# Patient Record
Sex: Female | Born: 1950 | Race: White | Hispanic: No | Marital: Married | Smoking: Never smoker
Health system: Southern US, Community
[De-identification: ages and names within clinical notes are randomized; demographics above are authoritative.]

## PROBLEM LIST (undated history)

## (undated) DIAGNOSIS — I10 Essential (primary) hypertension: Secondary | ICD-10-CM

## (undated) DIAGNOSIS — I341 Nonrheumatic mitral (valve) prolapse: Secondary | ICD-10-CM

## (undated) DIAGNOSIS — M1990 Unspecified osteoarthritis, unspecified site: Secondary | ICD-10-CM

## (undated) HISTORY — DX: Essential (primary) hypertension: I10

## (undated) HISTORY — PX: FOOT SURGERY: SHX648

## (undated) HISTORY — PX: APPENDECTOMY: SHX54

## (undated) HISTORY — DX: Unspecified osteoarthritis, unspecified site: M19.90

## (undated) HISTORY — DX: Nonrheumatic mitral (valve) prolapse: I34.1

## (undated) HISTORY — PX: TONSILLECTOMY: SUR1361

---

## 1993-06-15 HISTORY — PX: ABDOMINAL HYSTERECTOMY: SUR658

## 2014-03-19 DIAGNOSIS — E7800 Pure hypercholesterolemia, unspecified: Secondary | ICD-10-CM | POA: Insufficient documentation

## 2015-06-16 HISTORY — PX: REPLACEMENT TOTAL KNEE: SUR1224

## 2021-07-09 DIAGNOSIS — L409 Psoriasis, unspecified: Secondary | ICD-10-CM | POA: Insufficient documentation

## 2021-09-11 NOTE — Progress Notes (Signed)
?Terrilee Files D.O. ?[facility] ?[address] [place] [address] ?Phone: [phone] ?Subjective:   ?I, Wilford Grist, am serving as a scribe for Dr. Antoine Primas. ? ?This visit occurred during the SARS-CoV-2 public health emergency.  Safety protocols were in place, including screening questions prior to the visit, additional usage of staff PPE, and extensive cleaning of exam room while observing appropriate contact time as indicated for disinfecting solutions.  ? ?I'm seeing this patient by the request  of:  Nadara Eaton, MD ? ?CC: left hip pain  ? ?ONG:[id number]  ?Molly Cabrera is a 71 y.o. female coming in with complaint of L hip bursitis for past 8 months. Saw ortho and was given exercises for GT which she did not do. Wanted injection but other provider wanted her to have MRI. Pain has subsided as she has joined exercise class. Feels 70% better. Pain worse with walking up hills or stairs.  ? ?R elbow pain that she has started to notice in exercise class pain with extension over olecranon with weighted exercises. Denies any numbness or tingling.  ? ? ? ?  ? ?No past medical history on file. ? ?Social History  ? ?Socioeconomic History  ? Marital status: Married  ?  Spouse name: Not on file  ? Number of children: Not on file  ? Years of education: Not on file  ? Highest education level: Not on file  ?Occupational History  ? Not on file  ?Tobacco Use  ? Smoking status: Not on file  ? Smokeless tobacco: Not on file  ?Substance and Sexual Activity  ? Alcohol use: Not on file  ? Drug use: Not on file  ? Sexual activity: Not on file  ?Other Topics Concern  ? Not on file  ?Social History Narrative  ? Not on file  ? ?Social Determinants of Health  ? ?Financial Resource Strain: Not on file  ?Food Insecurity: Not on file  ?Transportation Needs: Not on file  ?Physical Activity: Not on file  ?Stress: Not on file  ?Social Connections: Not on file  ? ?Not on File ?No family history on file. ? ? ?Current  Outpatient Medications (Cardiovascular):  ?  atorvastatin (LIPITOR) 10 MG tablet, Take by mouth daily. ?  losartan-hydrochlorothiazide (HYZAAR) 100-25 MG tablet, Take 1 tablet by mouth daily. ? ? ? ?Current Outpatient Medications (Hematological):  ?  vitamin B-12 (CYANOCOBALAMIN) 1000 MCG tablet, Take 1,000 mcg by mouth once a week. ? ?Current Outpatient Medications (Other):  ?  cholecalciferol (VITAMIN D3) 25 MCG (1000 UNIT) tablet, Take 2,000 mcg by mouth once a week. ?  famotidine (PEPCID) 20 MG tablet, Take 20 mg by mouth 2 (two) times daily. ?  magnesium oxide (MAG-OX) 400 MG tablet, Take 400 mg by mouth 2 (two) times daily. ? ? ?Reviewed prior external information including notes and imaging from  ?primary care provider ?As well as notes that were available from care everywhere and other healthcare systems. ? ?Past medical history, social, surgical and family history all reviewed in electronic medical record.  No pertanent information unless stated regarding to the chief complaint.  ? ?Review of Systems: ? No headache, visual changes, nausea, vomiting, diarrhea, constipation, dizziness, abdominal pain, skin rash, fevers, chills, night sweats, weight loss, swollen lymph nodes, body aches, joint swelling, chest pain, shortness of breath, mood changes. POSITIVE muscle aches ? ?Objective  ?Blood pressure 120/76, pulse 75, height 5' 3.5" (1.613 m), SpO2 95 %. ?  ?General: No apparent distress alert  and oriented x3 mood and affect normal, dressed appropriately.  ?HEENT: Pupils equal, extraocular movements intact  ?Respiratory: Patient's speak in full sentences and does not appear short of breath  ?Cardiovascular: No lower extremity edema, non tender, no erythema  ?Gait normal with good balance and coordination.  ?MSK: Left hip exam does have severe tenderness to palpation over the greater trochanteric area.  Negative straight leg test.  No pain in the back itself.  Patient has 4 out of 5 strength of the gluteal  tendon compared to the contralateral side. ?Right elbow exam shows tenderness to palpation over the lateral epicondylar region.  Worsening pain with resisted wrist extension. ? ? ?Procedure: Real-time Ultrasound Guided Injection of left  greater trochanteric bursitis secondary to patient's body habitus ?Device: GE Logiq Q7  ?Ultrasound guided injection is preferred based studies that show increased duration, increased effect, greater accuracy, decreased procedural pain, increased response rate, and decreased cost with ultrasound guided versus blind injection.  ?Verbal informed consent obtained.  ?Time-out conducted.  ?Noted no overlying erythema, induration, or other signs of local infection.  ?Skin prepped in a sterile fashion.  ?Local anesthesia: Topical Ethyl chloride.  ?With sterile technique and under real time ultrasound guidance:  Greater trochanteric area was visualized and patient's bursa was noted. A 22-gauge 3 inch needle was inserted and 4 cc of 0.5% Marcaine and 1 cc of Kenalog 40 mg/dL was injected. Pictures taken ?Completed without difficulty  ?Pain immediately resolved suggesting accurate placement of the medication.  ?Advised to call if fevers/chills, erythema, induration, drainage, or persistent bleeding.  ?Impression: Technically successful ultrasound guided injection. ? ?Limited muscular skeletal ultrasound was performed and interpreted by Antoine Primas, M  ?Limited ultrasound of patient's right elbow shows the patient does have some hypoechoic changes that can be consistent with potential interstitial tearing of the common extensor tendon.  Patient does not have any significant neovascularization in the area.  Some cortical irregularities noted. 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 ?  ?Impression and Recommendations:  ?  ? ?The above documentation has been reviewed and is accurate and complete Judi Saa, DO ? ? ? ?

## 2021-09-16 ENCOUNTER — Ambulatory Visit: Payer: Medicare HMO | Admitting: Family Medicine

## 2021-09-16 ENCOUNTER — Ambulatory Visit: Payer: Self-pay

## 2021-09-16 VITALS — BP 120/76 | HR 75 | Ht 63.5 in

## 2021-09-16 DIAGNOSIS — M25552 Pain in left hip: Secondary | ICD-10-CM | POA: Diagnosis not present

## 2021-09-16 DIAGNOSIS — K52839 Microscopic colitis, unspecified: Secondary | ICD-10-CM | POA: Insufficient documentation

## 2021-09-16 DIAGNOSIS — M7062 Trochanteric bursitis, left hip: Secondary | ICD-10-CM | POA: Diagnosis not present

## 2021-09-16 DIAGNOSIS — M7711 Lateral epicondylitis, right elbow: Secondary | ICD-10-CM

## 2021-09-16 DIAGNOSIS — M7710 Lateral epicondylitis, unspecified elbow: Secondary | ICD-10-CM | POA: Insufficient documentation

## 2021-09-16 DIAGNOSIS — E559 Vitamin D deficiency, unspecified: Secondary | ICD-10-CM | POA: Insufficient documentation

## 2021-09-16 DIAGNOSIS — E538 Deficiency of other specified B group vitamins: Secondary | ICD-10-CM | POA: Insufficient documentation

## 2021-09-16 NOTE — Assessment & Plan Note (Signed)
Elbow anatomy was reviewed, and tendinopathy was explained. ? ?Pt. given a home rehab program. Start with isometrics and ROM, then a series of concentric and eccentric exercises should be done starting with no weight, work up to 1 lb, hammer, etc. ? ?Use counterforce strap if working or using hands. ? ?Formal PT would be beneficial. ?Emphasized stretching an cross-friction massage ?Emphasized proper palms up lifting biomechanics to unload ECRB ?Patient will follow-up again in 6 to 8 weeks ?

## 2021-09-16 NOTE — Patient Instructions (Addendum)
Injected GT  ?Exercises ?Wear brace day and night for 2 weeks and then at night for 2 weeks ?See me again in 7-8 weeks ? ?

## 2021-09-16 NOTE — Assessment & Plan Note (Signed)
Patient given injection today and tolerated the procedure well, discussed icing regimen and home exercises.  Discussed with patient that there is a possibility for a gluteal tendon tear previously that likely is improving at the moment.  Discussed which activities to do which ones to avoid.  Increase activity slowly.  Discussed which activities to do which wants to avoid.  Follow-up again in 6 to 8 weeks.  Worsening pain will consider repeating x-rays and potential advanced imaging but hopefully will not be necessary. ?

## 2021-10-21 NOTE — Progress Notes (Signed)
?Molly Cabrera D.O. ?[facility] ?[place] ?Phone: [phone] ?Subjective:   ?I, Molly Cabrera, am serving as a Education administrator for Dr. Hulan Saas. ?This visit occurred during the SARS-CoV-2 public health emergency.  Safety protocols were in place, including screening questions prior to the visit, additional usage of staff PPE, and extensive cleaning of exam room while observing appropriate contact time as indicated for disinfecting solutions.  ? ?I'm seeing this patient by the request  of:  Javier Glazier, MD ? ?CC: Elbow and hip pain follow-up ? ?[id number]  ?09/16/2021 ?Elbow anatomy was reviewed, and tendinopathy was explained. ?  ?Pt. given a home rehab program. Start with isometrics and ROM, then a series of concentric and eccentric exercises should be done starting with no weight, work up to 1 lb, hammer, etc. ?  ?Use counterforce strap if working or using hands. ?  ?Formal PT would be beneficial. ?Emphasized stretching an cross-friction massage ?Emphasized proper palms up lifting biomechanics to unload ECRB ?Patient will follow-up again in 6 to 8 weeks ? ?Patient given injection today and tolerated the procedure well, discussed icing regimen and home exercises.  Discussed with patient that there is a possibility for a gluteal tendon tear previously that likely is improving at the moment.  Discussed which activities to do which ones to avoid.  Increase activity slowly.  Discussed which activities to do which wants to avoid.  Follow-up again in 6 to 8 weeks.  Worsening pain will consider repeating x-rays and potential advanced imaging but hopefully will not be necessary. ? ?Updated 10/22/2021 ?Molly Cabrera is a 71 y.o. female coming in with complaint of hip and wrist pain. Hip has been doing well. Elbow doing better after wearing brace. But brace caused pain in wrist. No pain in hip or elbow.  Patient states she did fall backwards and did hurt her back a little bit.   Still has some pain when she does certain range of motion of the back mostly just going from a seated to standing position.  Patient denies any radiation down the leg, any numbness or tingling. ? ? ? ?  ?Social History  ? ?Socioeconomic History  ? Marital status: Married  ?  Spouse name: Not on file  ? Number of children: Not on file  ? Years of education: Not on file  ? Highest education level: Not on file  ?Occupational History  ? Not on file  ?Tobacco Use  ? Smoking status: Not on file  ? Smokeless tobacco: Not on file  ?Substance and Sexual Activity  ? Alcohol use: Not on file  ? Drug use: Not on file  ? Sexual activity: Not on file  ?Other Topics Concern  ? Not on file  ?Social History Narrative  ? Not on file  ? ?Social Determinants of Health  ? ?Financial Resource Strain: Not on file  ?Food Insecurity: Not on file  ?Transportation Needs: Not on file  ?Physical Activity: Not on file  ?Stress: Not on file  ?Social Connections: Not on file  ? ?Not on File ?No family history on file. ? ? ?Current Outpatient Medications (Cardiovascular):  ?  atorvastatin (LIPITOR) 10 MG tablet, Take by mouth daily. ?  losartan-hydrochlorothiazide (HYZAAR) 100-25 MG tablet, Take 1 tablet by mouth daily. ? ? ? ?Current Outpatient Medications (Hematological):  ?  vitamin B-12 (CYANOCOBALAMIN) 1000 MCG tablet, Take 1,000 mcg by mouth once a week. ? ?Current Outpatient Medications (Other):  ?  cholecalciferol (VITAMIN D3)  25 MCG (1000 UNIT) tablet, Take 2,000 mcg by mouth once a week. ?  famotidine (PEPCID) 20 MG tablet, Take 20 mg by mouth 2 (two) times daily. ?  magnesium oxide (MAG-OX) 400 MG tablet, Take 400 mg by mouth 2 (two) times daily. ? ? ? ? ?Objective  ?Blood pressure 120/84, pulse 71, height 5\' 3"  (1.6 m), weight 148 lb (67.1 kg), SpO2 96 %. ?  ?General: No apparent distress alert and oriented x3 mood and affect normal, dressed appropriately.  ?HEENT: Pupils equal, extraocular movements intact  ?Respiratory: Patient's  speak in full sentences and does not appear short of breath  ?Cardiovascular: No lower extremity edema, non tender, no erythema  ?Gait normal with good balance and coordination.  ?MSK: Mild arthritic changes of multiple joints.  Patient's right wrist does have very mild atrophy on the dorsal aspect compared to the contralateral side.  Good grip strength are noted.  Nontender on exam.  Full strength noted.  Patient's elbow no significant tenderness noted.  Back exam very mild loss of lordosis.  Minimal tenderness to palpation but no midline tenderness.  Mild tightness with FABER test but otherwise unremarkable. ? ?  ?Impression and Recommendations:  ?  ? ?The above documentation has been reviewed and is accurate and complete Lyndal Pulley, DO ? ? ? ?

## 2021-10-22 ENCOUNTER — Ambulatory Visit: Payer: Medicare HMO | Admitting: Family Medicine

## 2021-10-22 DIAGNOSIS — M7711 Lateral epicondylitis, right elbow: Secondary | ICD-10-CM | POA: Diagnosis not present

## 2021-10-22 DIAGNOSIS — S20229A Contusion of unspecified back wall of thorax, initial encounter: Secondary | ICD-10-CM

## 2021-10-22 NOTE — Assessment & Plan Note (Signed)
Patient did have more of a back contusion noted.  Discussed with patient at great length.  Patient declined any type of x-rays.  Patient is able to do all activities but does have some difficulty going from a seated to standing position from time to time.  Nothing that is stopping her though from the activities.  Increase activity slowly.  Follow-up with me again in 6 to 8 weeks. ?

## 2021-10-22 NOTE — Assessment & Plan Note (Addendum)
Patient seems completely healed at this time.  Did have some mild wrist pain now that seems to be more secondary to the immobilization with a brace.  Discussed with patient about icing regimen and home exercises.  Follow-up again as needed  ?

## 2021-10-22 NOTE — Patient Instructions (Signed)
Arnica lotion 2x a day to bum ?Wrist should be good once moving ?If not good by memorial day give [place] a call ?

## 2021-11-03 ENCOUNTER — Ambulatory Visit (INDEPENDENT_AMBULATORY_CARE_PROVIDER_SITE_OTHER): Payer: Medicare HMO

## 2021-11-03 ENCOUNTER — Ambulatory Visit: Payer: Medicare HMO | Admitting: Family Medicine

## 2021-11-03 ENCOUNTER — Ambulatory Visit: Payer: Self-pay

## 2021-11-03 VITALS — BP 148/92 | HR 78 | Ht 63.0 in | Wt 149.2 lb

## 2021-11-03 DIAGNOSIS — M25531 Pain in right wrist: Secondary | ICD-10-CM | POA: Diagnosis not present

## 2021-11-03 NOTE — Patient Instructions (Addendum)
Thank you for coming in today.   I've referred you to Physical Therapy.  Let us know if you don't hear from them in one week.   Please use Voltaren gel (Generic Diclofenac Gel) up to 4x daily for pain as needed.  This is available over-the-counter as both the name brand Voltaren gel and the generic diclofenac gel.   Please get an Xray today before you leave   Try using heat  Thumb spica splint if needed  Check back in 1 month

## 2021-11-03 NOTE — Progress Notes (Signed)
I, Christoper Fabian, LAT, ATC, am serving as scribe for Dr. Clementeen Graham.  Molly Cabrera is a 71 y.o. female who presents to [facility] today for for f/u of R wrist pain.  She was last seen by Dr. Katrinka Blazing on 10/22/21 for low back, R hip and wrist pain and for f/u of R lateral epicondylitis. She was advised to use Arnica lotion. Today, pt reports 5 days ago R wrist pain worsened and is severe. Pt locates pain to the radial aspect of the R wrist.  R wrist swelling: no Aggravating factors: use Treatments tried:  meloxicam, ice   Pertinent review of systems: No fevers or chills  Relevant historical information: Psoriasis   Exam:  BP (!) 148/92   Pulse 78   Ht 5\' 3"  (1.6 m)   Wt 149 lb 3.2 oz (67.7 kg)   SpO2 95%   BMI 26.43 kg/m  General: Well Developed, well nourished, and in no acute distress.   MSK: Right hand and wrist: Bossing and swelling at first [facility]. Decreased wrist motion. Tender palpation at insertion of flexor carpi ulnaris tendon. Pain with resisted wrist flexion and radial deviation. Minimally positive Finkelstein's test. Intact strength. Pulses capillary refill and sensation are intact distally.    Lab and Radiology Results  Diagnostic Limited MSK Ultrasound of: Right wrist Mild hypoechoic fluid surrounds FCU tendon. Calcific change present at insertion of FCU tendon. First dorsal wrist compartment normal-appearing Degenerative changes mild effusion at first [facility]. 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   X-ray images right wrist obtained today personally and independently interpreted. First CMC DJD. No acute fractures are present. Await formal radiology review  Assessment and Plan: 70 y.o. female with right radial wrist pain.  Pain most likely to be due to FCU tenosynovitis and first CMC DJD.  She has had several wrist and elbow pain conditions recently.  I think she would benefit from some hand  therapy.  Plan to refer to hand therapy.  Also recommend Voltaren gel and if needed thumb spica brace. Recheck in 1 month.  Consider injection if needed although I would like to avoid that if possible.   PDMP not reviewed this encounter. Orders Placed This Encounter  Procedures   61 LIMITED JOINT SPACE STRUCTURES UP RIGHT(NO LINKED CHARGES)    Order Specific Question:   Reason for Exam (SYMPTOM  OR DIAGNOSIS REQUIRED)    Answer:   right wrist pain    Order Specific Question:   Preferred imaging location?    Answer:   [facility] Wrist Complete Right    Standing Status:   Future    Number of Occurrences:   1    Standing Expiration Date:   11/04/2022    Order Specific Question:   Reason for Exam (SYMPTOM  OR DIAGNOSIS REQUIRED)    Answer:   eval wrist pain    Order Specific Question:   Preferred imaging location?    Answer:   11/06/2022   Ambulatory referral to Physical Therapy    Referral Priority:   Routine    Referral Type:   Physical Medicine    Referral Reason:   Specialty Services Required    Requested Specialty:   Physical Therapy    Number of Visits Requested:   1   No orders of the defined types were placed in this encounter.    Discussed warning signs or symptoms. Please see discharge  instructions. Patient expresses understanding.   The above documentation has been reviewed and is accurate and complete Lynne Leader, M.D.

## 2021-11-04 ENCOUNTER — Ambulatory Visit: Payer: Medicare HMO | Admitting: Family Medicine

## 2021-11-04 NOTE — Progress Notes (Signed)
Right wrist x-ray shows severe arthritis changes at the base of the thumb

## 2021-11-17 ENCOUNTER — Encounter: Payer: Self-pay | Admitting: Family Medicine

## 2021-11-18 ENCOUNTER — Encounter: Payer: Self-pay | Admitting: Family Medicine

## 2021-11-19 ENCOUNTER — Ambulatory Visit (HOSPITAL_COMMUNITY)
Admission: RE | Admit: 2021-11-19 | Discharge: 2021-11-19 | Disposition: A | Discharge status: Home / Self Care | Payer: Medicare HMO | Source: Ambulatory Visit | Attending: Cardiovascular Disease | Admitting: Cardiovascular Disease

## 2021-11-19 ENCOUNTER — Ambulatory Visit: Payer: Self-pay

## 2021-11-19 ENCOUNTER — Ambulatory Visit: Payer: Medicare HMO | Admitting: Family Medicine

## 2021-11-19 VITALS — BP 148/84 | HR 74 | Ht 63.0 in

## 2021-11-19 DIAGNOSIS — M79631 Pain in right forearm: Secondary | ICD-10-CM | POA: Diagnosis not present

## 2021-11-19 DIAGNOSIS — M25531 Pain in right wrist: Secondary | ICD-10-CM

## 2021-11-19 DIAGNOSIS — I808 Phlebitis and thrombophlebitis of other sites: Secondary | ICD-10-CM | POA: Diagnosis not present

## 2021-11-19 NOTE — Patient Instructions (Signed)
Thank you for coming in today.   Please use Voltaren gel (Generic Diclofenac Gel) up to 4x daily for pain as needed.  This is available over-the-counter as both the name brand Voltaren gel and the generic diclofenac gel.   Heat and compression can help.   Recheck on return. Keep  me updated and if we need blood thinners I can prescribe them initially.   Thrombophlebitis  Thrombophlebitis is a condition in which a blood clot and inflammation occur inside a vein. This can happen in the arms, legs, or torso. Thrombophlebitis may involve superficial veins, deep veins, or both. Superficial veins are close to the surface of the body and are part of the superficial venous system. Veins that are deeper inside the body are part of the deep venous system. When this condition happens in a superficial vein (superficial thrombophlebitis), it is usually not serious.However, when the condition happens in a vein that is part of the deep venous system (deep vein thrombosis, DVT), it can cause serious problems. What are the causes? This condition may be caused by: Infection, injury, or trauma to a vein. Inflammation of the veins. Medical conditions that can cause blood to clot more easily (hypercoagulable state). Backing up, or reflux, of blood flow through the veins (chronic venous insufficiency or venous stasis). What increases the risk? The following factors may make you more likely to develop this condition: Having a long, thin tube (catheter) put in a vein, such as a central line, port, or IV catheter. Getting certain medicines through a catheter that can irritate the vein. Pregnancy or having recently given birth. Cancer. Obesity. Taking oral contraceptive pills (OCPs) or hormone therapy (HT) medicines. Spasms of veins. Immobilization, or not moving the limbs for prolonged periods. What are the signs or symptoms? The main symptoms of this condition are: Swelling and pain in an arm or leg. If the  affected vein is in the leg, you may feel pain while standing or walking. Warmth or redness in an arm or leg. Tenderness in the affected area when it is touched. Other symptoms include: Low-grade fever. Muscle aches. A bulging or hard vein (venous distension). In some cases, there are no symptoms. How is this diagnosed? This condition may be diagnosed based on: Your symptoms and medical history. A physical exam. Tests, such as a test that uses sound waves to make images (duplex ultrasound). How is this treated? Treatment depends on how severe the condition is and which area of the body is affected. Treatment may include: Applying a warm compress or heating pad to affected areas. This may need to be repeated several times a day. Moving the affected limb. For example, you may need to start doing walking exercises right away. You will also be encouraged to continue your usual daily activities. Raising (elevating) the affected arm or leg above the level of your heart. Medicines, such as: Anti-inflammatory medicines, such as ibuprofen. Blood thinners (anticoagulants) or anti-platelet drugs such as aspirin. Removing an IV or central line that may be causing the problem. Wearing compression stockings to help prevent blood clots and reduce swelling in your legs. Follow these instructions at home: Medicines Take over-the-counter and prescription medicines only as told by your health care provider. If you are taking blood thinners: Talk with your health care provider before you take any medicines that contain aspirin or NSAIDs, such as ibuprofen. These medicines increase your risk for dangerous bleeding. Take your medicine exactly as told, at the same time every day. Avoid  activities that could cause injury or bruising, and follow instructions about how to prevent falls. Wear a medical alert bracelet or carry a card that lists what medicines you take. Managing pain, stiffness, and swelling  If  directed, apply heat to the affected area as often as told by your health care provider. Use the heat source that your health care provider recommends, such as a moist heat pack or a heating pad. Place a towel between your skin and the heat source. Leave the heat on for 20-30 minutes. Remove the heat if your skin turns bright red. This is especially important if you are unable to feel pain, heat, or cold. You have a greater risk of getting burned. Elevate the affected area above the level of your heart while you are sitting or lying down. Activity Return to your normal activities as told by your health care provider. Ask your health care provider what activities are safe for you. Avoid sitting for a long time without moving. Get up to take short walks every 1-2 hours. This is important to improve blood flow and breathing. Ask for help if you feel weak or unsteady. Do exercises as told by your health care provider. Rest as told by your health care provider. General instructions Drink enough fluid to keep your urine pale yellow. Wear compression stockings as told by your health care provider. Do not use any products that contain nicotine or tobacco. These products include cigarettes, chewing tobacco, and vaping devices, such as e-cigarettes. If you need help quitting, ask your health care provider. Keep all follow-up visits. This is important. Contact a health care provider if: You miss a dose of your blood thinner, if applicable. Your symptoms do not improve. You have unusual bruising. You have nausea, vomiting, or diarrhea that lasts for more than a day. Get help right away if: You are breathing fast or have chest pain. You have blood in your vomit, urine, or stool. You have severe pain in your affected arm or leg or new pain in any arm or leg. You have light-headedness, dizziness, a severe headache, or confusion. These symptoms may represent a serious problem that is an emergency. Do not  wait to see if the symptoms will go away. Get medical help right away. Call your local emergency services (911 in the [place] Do not drive yourself to the hospital. Summary Thrombophlebitis is a condition in which a blood clot forms in a vein, causing inflammation and often pain. This can happen in both superficial and deep veins. The main symptom of this condition is swelling and pain around the affected vein. Tenderness and redness may also be present. Treatment may include warm compresses, compression stockings, anti-inflammatory medicines, or blood thinners. Make sure you take all medicines, especially blood thinners, as instructed and keep all follow-up visits to ensure proper healing of the vein. This information is not intended to replace advice given to you by your health care provider. Make sure you discuss any questions you have with your health care provider. Document Revised: 11/25/2020 Document Reviewed: 11/25/2020 Elsevier Patient Education  2023 [employer].

## 2021-11-19 NOTE — Progress Notes (Signed)
   I, Philbert Riser, LAT, ATC acting as a scribe for Clementeen Graham, MD.  Molly Cabrera is a 71 y.o. female who presents to [facility] at [facility] today for R wrist/arm pain thought to be most likely to FCU tenosynovitis and first CMC DJD. Pt was last seen by Dr. Denyse Amass on 11/03/21 and advised to use Voltaren gel and thumb spica brace if needed. Pt was also referred to [facility]. Pt sent [place] a MyChart message on 6/5 and 6/6 reporting bruising and swelling on the anterior-ulnar aspect of her R forearm from a massage done at hand therapy. Today, pt reports a bump has formed along anterior aspect of the R forearm that is very painful. The bump formed about 1 week after the massage from PT. Pt is leaving the the [facility] on Friday.  Dx imaging: 11/03/21 R wrist XR  Pertinent review of systems: No fevers or chills.  No chest pain palpitations or shortness of breath.  Relevant historical information: Psoriasis.  No history of DVT.   Exam:  BP (!) 148/84   Pulse 74   Ht 5\' 3"  (1.6 m)   SpO2 97%   BMI 26.43 kg/m  General: Well Developed, well nourished, and in no acute distress.   MSK: Right forearm: Mild swelling volar forearm.  Mild bruising volar ulnar forearm.  Normal wrist motion some pain with extension. Tender palpation mid volar forearm with tender palpable cord.  Pulses capillary fill and sensation are intact distally.    Lab and Radiology Results  Diagnostic Limited MSK Ultrasound of: Right palmar forearm Area of tenderness and swelling visualized. Just distal to it there appears to be a hypoechoic vein however there is a solid structure within the lumen of the vein at the area of swelling consistent with a superficial thrombophlebitis. Renal artery is normal-appearing Mild soft tissue hypoechoic swelling superficial to the presumed thrombophlebitis.  Tendinous structures normal appearing Impression: Superficial venous thrombophlebitis palmar  forearm      Assessment and Plan: 71 y.o. female with Superficial venous thrombophlebitis right forearm. Plan to confirm this diagnosis with a vascular ultrasound scheduled for later today.  This should help determine if it is nearing the deep vein system.  We will treat with compression and Voltaren gel and heat.  If the superficial clot is approaching the deep vein system we will start Eliquis or Xarelto.   PDMP not reviewed this encounter. Orders Placed This Encounter  Procedures   61 LIMITED JOINT SPACE STRUCTURES UP RIGHT(NO LINKED CHARGES)    Order Specific Question:   Reason for Exam (SYMPTOM  OR DIAGNOSIS REQUIRED)    Answer:   right wrist pain    Order Specific Question:   Preferred imaging location?    Answer:   [facility]   No orders of the defined types were placed in this encounter.    Discussed warning signs or symptoms. Please see discharge instructions. Patient expresses understanding.   The above documentation has been reviewed and is accurate and complete US, M.D.

## 2021-11-20 NOTE — Progress Notes (Signed)
Vascular ultrasound does show a superficial venous thrombosis.  This is the phlebitis or superficial venous phlebitis that we talked about in clinic yesterday.  Treat with compression and Voltaren gel.  No DVT is seen.  Recommend recheck with me after you get back from your trip.  I hope you are able to enjoy your [facility] trip

## 2021-12-02 NOTE — Progress Notes (Unsigned)
    Molly Cabrera is a 71 y.o. female who presents to [facility] at [facility] today for f/u of R wrist pain due to Superficial venous thrombophlebitis right forearm and to FCU tenosynovitis.  She was last seen by Dr Denyse Amass on 11/19/21 and was referred for a venous doppler US that was positive for superficial venous thrombosis.  She was advised to use Voltaren gel and compression.  Today, pt reports   Dx imaging: 11/03/21 R wrist XR Pertinent review of systems: ***  Relevant historical information: ***   Exam:  There were no vitals taken for this visit. General: Well Developed, well nourished, and in no acute distress.   MSK: ***    Lab and Radiology Results No results found for this or any previous visit (from the past 72 hour(s)). No results found.     Assessment and Plan: 71 y.o. female with ***   PDMP not reviewed this encounter. No orders of the defined types were placed in this encounter.  No orders of the defined types were placed in this encounter.    Discussed warning signs or symptoms. Please see discharge instructions. Patient expresses understanding.   ***

## 2021-12-05 ENCOUNTER — Encounter: Payer: Self-pay | Admitting: Family Medicine

## 2021-12-05 ENCOUNTER — Ambulatory Visit: Payer: Medicare HMO | Admitting: Family Medicine

## 2021-12-05 VITALS — BP 158/92 | HR 72 | Ht 63.0 in | Wt 148.2 lb

## 2021-12-05 DIAGNOSIS — M720 Palmar fascial fibromatosis [Dupuytren]: Secondary | ICD-10-CM | POA: Diagnosis not present

## 2021-12-05 DIAGNOSIS — I808 Phlebitis and thrombophlebitis of other sites: Secondary | ICD-10-CM | POA: Diagnosis not present

## 2021-12-05 HISTORY — DX: Palmar fascial fibromatosis (dupuytren): M72.0

## 2022-05-11 ENCOUNTER — Ambulatory Visit: Payer: Medicare HMO | Admitting: Family Medicine

## 2022-05-11 ENCOUNTER — Ambulatory Visit (INDEPENDENT_AMBULATORY_CARE_PROVIDER_SITE_OTHER): Payer: Medicare HMO

## 2022-05-11 VITALS — BP 150/88 | HR 66 | Ht 63.0 in | Wt 151.0 lb

## 2022-05-11 DIAGNOSIS — K52839 Microscopic colitis, unspecified: Secondary | ICD-10-CM

## 2022-05-11 DIAGNOSIS — M25521 Pain in right elbow: Secondary | ICD-10-CM

## 2022-05-11 NOTE — Patient Instructions (Addendum)
Thank you for coming in today.   I really like Dr Sabra Heck at [facility] gastro. The other doc there are great also I just have a little more experience with him.   I think you elbow is triceps tendonitis.   Use the triceps weight machine and let it down easy with one arm. Try to do 1-2 sets of 30 ish.   Ok to use voltaren gel

## 2022-05-11 NOTE — Progress Notes (Unsigned)
   I, Philbert Riser, LAT, ATC acting as a scribe for Clementeen Graham, MD.  Molly Cabrera is a 71 y.o. female who presents to [facility] at [facility] today for R elbow pain.  She was last seen by Dr Denyse Amass on 12/05/21 for R wrist pain due to superficial venous thrombophlebitis right forearm and to FCU tenosynovitis. Today, pt reports R pain that has worsened w/ muscle spasm into her R upper arm over the last few days. Pt locates pain to the posterior aspect of the R elbow  Needs gastroenterologist for microscopic colitis****  Grip strength: normal Radiates: no UE Numbness/tingling: Aggravates: worse at night, elbow ext,  Treatments tried: Dx testing: 11/20/10 R UE vasc US venous duplex 11/03/21 R wrist XR   Pertinent review of systems: No fevers or chills  Relevant historical information: thumb arthritis   Exam:  BP (!) 150/88   Pulse 66   Ht 5\' 3"  (1.6 m)   Wt 151 lb (68.5 kg)   SpO2 97%   BMI 26.75 kg/m  General: Well Developed, well nourished, and in no acute distress.   MSK: Right elbow: Normal-appearing Tender palpation at the triceps tendon insertion on the olecranon.***    Lab and Radiology Results No results found for this or any previous visit (from the past 72 hour(s)). No results found.     Assessment and Plan: 71 y.o. female with ***   PDMP not reviewed this encounter. Orders Placed This Encounter  Procedures   DG ELBOW COMPLETE RIGHT (3+VIEW)    Standing Status:   Future    Number of Occurrences:   1    Standing Expiration Date:   05/12/2023    Order Specific Question:   Reason for Exam (SYMPTOM  OR DIAGNOSIS REQUIRED)    Answer:   elbow pain    Order Specific Question:   Preferred imaging location?    Answer:   05/14/2023   Ambulatory referral to Gastroenterology    Referral Priority:   Routine    Referral Type:   Consultation    Referral Reason:   Specialty Services Required    Number of Visits Requested:   1   No  orders of the defined types were placed in this encounter.    Discussed warning signs or symptoms. Please see discharge instructions. Patient expresses understanding.   ***

## 2022-05-13 NOTE — Progress Notes (Signed)
Right elbow x-ray shows arthritis changes.  There are potential loose bodies in the elbow that could cause some restrictions.  If you do not get better an MRI would be helpful.

## 2022-05-26 ENCOUNTER — Ambulatory Visit: Payer: Medicare HMO | Admitting: Gastroenterology

## 2022-05-26 ENCOUNTER — Encounter: Payer: Self-pay | Admitting: Gastroenterology

## 2022-05-26 VITALS — BP 114/68 | HR 62 | Ht 63.0 in

## 2022-05-26 DIAGNOSIS — K219 Gastro-esophageal reflux disease without esophagitis: Secondary | ICD-10-CM | POA: Diagnosis not present

## 2022-05-26 DIAGNOSIS — K52839 Microscopic colitis, unspecified: Secondary | ICD-10-CM | POA: Diagnosis not present

## 2022-05-26 MED ORDER — BUDESONIDE 3 MG PO CPEP: 9.0000 mg | ORAL_CAPSULE | Freq: Every day | ORAL | 2 refills | Status: AC

## 2022-05-26 NOTE — Progress Notes (Signed)
Assessment    Microscopic colitis GERD  Recommendations   Budesonide 9 mg daily for a total of 6 weeks then 6 mg daily for 2 weeks then 3 mg daily for 2 weeks then discontinue. Follow antireflux measures and continue famotidine 20 mg twice daily Request colonoscopy, EGD and pathology records from Dr. Maree Krabbe office in [place]    HPI   Chief complaint: Diarrhea, microscopic colitis  Patient profile:  Molly Cabrera is a 71 y.o. female referred by Wilhelmina Mcardle, MD with microscopic colitis and diarrhea.  She is accompanied by her husband.  She states she was diagnosed with microscopic colitis in 2018 in [place], [place].  She relates she had a colonoscopy and EGD performed in 2018 by Dr. Arlana Pouch in [place]. [place].  Colonoscopy records are not available.  Partial EGD records show a 2 cm hiatal hernia and otherwise normal EGD. Her symptoms were controlled with a several week course of budesonide.  Her symptoms had not been active until few weeks ago when her diarrhea returned. She was prescribed budesonide 9 mg daily by Dr. Maree Krabbe office and states her diarrhea is now 75% better.  She notes loose watery stools with crampy mid abdominal pain.  Her reflux symptoms are well-controlled on famotidine. Denies weight loss,  constipation, change in stool caliber, melena, hematochezia, nausea, vomiting, dysphagia, reflux symptoms, chest pain.   Previous Labs / Imaging::     No data to display          No results found for: "LIPASE"     No data to display           Previous GI evaluation    Endoscopies:  See HPI  Imaging:     Past Medical History:  Diagnosis Date   Arthritis    Dupuytren's contracture of left hand 12/05/2021   Mild.  Watchful waiting at this time (may 2023)    Hypertension    Mitral valve prolapse    Past Surgical History:  Procedure Laterality Date   ABDOMINAL HYSTERECTOMY  1995   APPENDECTOMY     FOOT SURGERY     pin in toe   REPLACEMENT TOTAL KNEE Left  2017   TONSILLECTOMY     as a child   Family History  Problem Relation Age of Onset   Heart disease Mother    Cancer Father        larynx   Colon cancer Neg Hx    Social History   Tobacco Use   Smoking status: Never   Smokeless tobacco: Never  Substance Use Topics   Alcohol use: Yes    Comment: 1 to 2 glasses daily   Drug use: Never   Current Outpatient Medications  Medication Sig Dispense Refill   atorvastatin (LIPITOR) 10 MG tablet Take by mouth daily.     budesonide (ENTOCORT EC) 3 MG 24 hr capsule Take 9 mg by mouth daily. X 2 weeks then reduce to 1 capsule daily     cholecalciferol (VITAMIN D3) 25 MCG (1000 UNIT) tablet Take 2,000 mcg by mouth once a week.     famotidine (PEPCID) 20 MG tablet Take 20 mg by mouth 2 (two) times daily.     losartan-hydrochlorothiazide (HYZAAR) 100-25 MG tablet Take 1 tablet by mouth daily.     vitamin B-12 (CYANOCOBALAMIN) 1000 MCG tablet Take 1,000 mcg by mouth once a week.     No current facility-administered medications for this visit.   Allergies  Allergen Reactions   Sulfa  Antibiotics Rash    Review of Systems: All other systems reviewed and negative except where noted in HPI.    Physical Exam    Wt Readings from Last 3 Encounters:  05/11/22 151 lb (68.5 kg)  12/05/21 148 lb 3.2 oz (67.2 kg)  11/03/21 149 lb 3.2 oz (67.7 kg)    BP 114/68   Pulse 62   Ht 5\' 3"  (1.6 m)   SpO2 98%   BMI 26.75 kg/m  Constitutional:  Generally well appearing female in no acute distress. HEENT: Pupils normal.  Conjunctivae are normal. No scleral icterus. No oral lesions or deformities noted.  Neck: Supple.  Cardiac: Normal rate, regular rhythm without murmurs. Pulmonary/chest: Effort normal and breath sounds normal. No wheezing, rales or rhonchi. Abdominal: Soft, nondistended, mild periumbilical tenderness. Active bowel sounds. No palpable HSM, masses or hernias. Rectal: Not done Extremities: No edema or deformities noted Neurological:  Alert and oriented to person, place and time. Psychiatric: Pleasant. Normal mood and affect. Behavior is normal. Skin: Skin is warm and dry. No rashes noted.  Lucio Edward, MD   cc:  Referring Provider Javier Glazier, MD

## 2022-05-26 NOTE — Patient Instructions (Signed)
We have sent the following medications to your pharmacy for you to pick up at your convenience: budesonide 9 mg (3 capsules) by mouth daily x 4 weeks, then reduce to 6 mg (2 capsules) by mouth daily x 2 weeks, then reduce to 3 mg (1 capsule) by mouth daily x 2 weeks then stop.  The [facility] providers would like to encourage you to use [facility] to communicate with providers for non-urgent requests or questions.  Due to long hold times on the telephone, sending your provider a message by [facility] may be a faster and more efficient way to get a response.  Please allow 48 business hours for a response.  Please remember that this is for non-urgent requests.   Thank you for choosing me and [facility].  Venita Lick. Pleas Koch., MD., Clementeen Graham

## 2022-10-02 ENCOUNTER — Encounter: Payer: Self-pay | Admitting: Family Medicine

## 2022-12-21 NOTE — Progress Notes (Unsigned)
   Rubin Payor, PhD, LAT, ATC acting as a scribe for Clementeen Graham, MD.  Molly Cabrera is a 72 y.o. female who presents to [facility] at [facility] today for bilat foot pain. Pt was previously seen by Dr. Denyse Amass on 05/11/22 for R elbow pain.  Today, pt c/o bilat foot pain x  ***. Pt locates pain to ***  Aggravates: Treatments tried:  Pertinent review of systems: ***  Relevant historical information: ***   Exam:  There were no vitals taken for this visit. General: Well Developed, well nourished, and in no acute distress.   MSK: ***    Lab and Radiology Results No results found for this or any previous visit (from the past 72 hour(s)). No results found.     Assessment and Plan: 72 y.o. female with ***   PDMP not reviewed this encounter. No orders of the defined types were placed in this encounter.  No orders of the defined types were placed in this encounter.    Discussed warning signs or symptoms. Please see discharge instructions. Patient expresses understanding.   ***

## 2022-12-22 ENCOUNTER — Ambulatory Visit (INDEPENDENT_AMBULATORY_CARE_PROVIDER_SITE_OTHER): Payer: Medicare HMO

## 2022-12-22 ENCOUNTER — Encounter: Payer: Self-pay | Admitting: Family Medicine

## 2022-12-22 ENCOUNTER — Ambulatory Visit: Payer: Medicare HMO | Admitting: Family Medicine

## 2022-12-22 ENCOUNTER — Other Ambulatory Visit: Payer: Self-pay

## 2022-12-22 VITALS — BP 128/70 | HR 75 | Ht 63.0 in | Wt 138.8 lb

## 2022-12-22 DIAGNOSIS — M25572 Pain in left ankle and joints of left foot: Secondary | ICD-10-CM

## 2022-12-22 DIAGNOSIS — M79672 Pain in left foot: Secondary | ICD-10-CM

## 2022-12-22 DIAGNOSIS — G8929 Other chronic pain: Secondary | ICD-10-CM

## 2022-12-22 DIAGNOSIS — M79671 Pain in right foot: Secondary | ICD-10-CM

## 2022-12-22 NOTE — Patient Instructions (Signed)
Thank you for coming in today.   Try scaphoid pads from [email or web address]  Try voltaren gel .   Recheck in 1 month.

## 2022-12-25 NOTE — Progress Notes (Signed)
Left foot and ankle x-ray shows some arthritis in the midfoot and in the big toe

## 2022-12-25 NOTE — Progress Notes (Signed)
Right foot x-ray shows prior bunionectomy and some bone spurs and flatfoot.

## 2023-01-15 NOTE — Progress Notes (Unsigned)
   Rubin Payor, PhD, LAT, ATC acting as a scribe for Clementeen Graham, MD.  Molly Cabrera is a 72 y.o. female who presents to [facility] at [facility] today for f/u bilat foot and ankle pain. Pt was last seen by Dr. Denyse Amass on 12/22/22 and was advised to try scaphoid pads and cont wearing good comfortable shoes.  Today, pt reports feet/ankles are feeling somewhat better. She has been wearing the scaphoid pads in her shoes. She has been resting as of late due to a COVID infection.   Dx imaging: 12/22/22 R & L foot and L ankle XR  Pertinent review of systems: No fevers or chills  Relevant historical information: History of microscopic colitis   Exam:  BP 138/86   Ht 5\' 3"  (1.6 m)   Wt 139 lb (63 kg)   BMI 24.62 kg/m  General: Well Developed, well nourished, and in no acute distress.   MSK: Feet bilaterally normal appearing.  Morton's foot pattern is present. Normal foot and ankle motion.  Normal gait.       Assessment and Plan: 72 y.o. 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  Significant improvement with scaphoid pads.  Watchful waiting as she advances her activity.  Recheck as needed.   PDMP not reviewed this encounter. No orders of the defined types were placed in this encounter.  No orders of the defined types were placed in this encounter.    Discussed warning signs or symptoms. Please see discharge instructions. Patient expresses understanding.   The above documentation has been reviewed and is accurate and complete Clementeen Graham, M.D.

## 2023-01-18 ENCOUNTER — Ambulatory Visit: Payer: Medicare HMO | Admitting: Family Medicine

## 2023-01-18 VITALS — BP 138/86 | Ht 63.0 in | Wt 139.0 lb

## 2023-01-18 DIAGNOSIS — M79671 Pain in right foot: Secondary | ICD-10-CM | POA: Diagnosis not present

## 2023-01-18 DIAGNOSIS — M79672 Pain in left foot: Secondary | ICD-10-CM

## 2023-01-18 NOTE — Patient Instructions (Addendum)
Thank you for coming in today.   Continue wearing the scaphoid pads in your shoes  Recheck as needed.   Let me know if this is not working.

## 2023-06-18 ENCOUNTER — Ambulatory Visit: Payer: Medicare HMO | Admitting: Gastroenterology

## 2023-06-18 ENCOUNTER — Encounter: Payer: Self-pay | Admitting: Gastroenterology

## 2023-06-18 VITALS — BP 132/78 | HR 67 | Ht 63.0 in | Wt 142.0 lb

## 2023-06-18 DIAGNOSIS — K219 Gastro-esophageal reflux disease without esophagitis: Secondary | ICD-10-CM | POA: Diagnosis not present

## 2023-06-18 MED ORDER — FAMOTIDINE 40 MG PO TABS: 40.0000 mg | ORAL_TABLET | Freq: Two times a day (BID) | ORAL | 3 refills | Status: DC

## 2023-06-18 NOTE — Progress Notes (Signed)
 I agree with assessment and plan as outlined by Ms. Zehr.  I would recommend a repeat EGD since her last endoscopy was back in 2018 and patient does seem to have some mild dysphagia (food going down slowly).  I agree with potentially considering Voquezna if patient does not respond to high dose Pepcid  since the patient has not been tolerant of PPIs (due to microscopic colitis) in the past.

## 2023-06-18 NOTE — Patient Instructions (Signed)
 We have sent the following medications to your pharmacy for you to pick up at your convenience: Pepcid  40 mg twice daily.   Call or send Mychart message in 4-6 weeks with an update.  _______________________________________________________  If your blood pressure at your visit was 140/90 or greater, please contact your primary care physician to follow up on this.  _______________________________________________________  If you are age 75 or older, your body mass index should be between 23-30. Your Body mass index is 25.15 kg/m. If this is out of the aforementioned range listed, please consider follow up with your Primary Care Provider.  If you are age 66 or younger, your body mass index should be between 19-25. Your Body mass index is 25.15 kg/m. If this is out of the aformentioned range listed, please consider follow up with your Primary Care Provider.   ________________________________________________________  The [facility] providers would like to encourage you to use MYCHART to communicate with providers for non-urgent requests or questions.  Due to long hold times on the telephone, sending your provider a message by [facility] may be a faster and more efficient way to get a response.  Please allow 48 business hours for a response.  Please remember that this is for non-urgent requests.  _______________________________________________________

## 2023-06-18 NOTE — Progress Notes (Addendum)
 06/18/2023 Molly Cabrera [id number] 13-Feb-1951   HISTORY OF PRESENT ILLNESS: This is a 73 year old female who was previously seen by Dr. Aneita.  Her care will be assumed by Dr. Federico in his absence.  She has history of GERD that had caused issues with chronic cough and microscopic colitis from a GI standpoint.  Has been treated intermittently with budesonide  since her colonoscopy in 2018.  She was last treated about a year ago.  She is no longer on budesonide  at this time.  Tells me that she had issues with a chronic cough in the past that they determined was due to reflux.  Had been on PPIs since 2005 for a long time then after her diagnosis of microscopic colitis in 2018 she was told that she should not take PPIs.  Is now on Pepcid  20 mg twice daily.  Recently has been having issues with reflux again particularly at nighttime, but no regularly.  Has developed some cough again as well.  She notices reflux especially with red wine.  She says she feels like overall they try not to eat late in the evening, but have been eating later recently since moving to this area.  She said that she started to incline the head of her bed about a week ago.  Has noticed for years that sometimes food seems to go down slowly, but no worsening of those symptoms with time.   Past Medical History:  Diagnosis Date   Arthritis    Dupuytren's contracture of left hand 12/05/2021   Mild.  Watchful waiting at this time (may 2023)    Hypertension    Mitral valve prolapse    Past Surgical History:  Procedure Laterality Date   ABDOMINAL HYSTERECTOMY  1995   APPENDECTOMY     FOOT SURGERY     pin in toe   REPLACEMENT TOTAL KNEE Left 2017   TONSILLECTOMY     as a child    reports that she has never smoked. She has never used smokeless tobacco. She reports current alcohol use. She reports that she does not use drugs. family history includes Cancer in her father; Heart disease in her mother. Allergies  Allergen  Reactions   Sulfa Antibiotics Rash      Outpatient Encounter Medications as of 06/18/2023  Medication Sig   atorvastatin (LIPITOR) 10 MG tablet Take by mouth daily.   cholecalciferol (VITAMIN D3) 25 MCG (1000 UNIT) tablet Take 2,000 mcg by mouth once a week.   famotidine  (PEPCID ) 20 MG tablet Take 20 mg by mouth 2 (two) times daily.   losartan-hydrochlorothiazide (HYZAAR) 100-25 MG tablet Take 1 tablet by mouth daily.   vitamin B-12 (CYANOCOBALAMIN) 1000 MCG tablet Take 1,000 mcg by mouth once a week.   budesonide  (ENTOCORT EC ) 3 MG 24 hr capsule Take by mouth. (Patient not taking: Reported on 06/18/2023)   No facility-administered encounter medications on file as of 06/18/2023.    REVIEW OF SYSTEMS  : All other systems reviewed and negative except where noted in the History of Present Illness.   PHYSICAL EXAM: BP 132/78   Pulse 67   Wt 142 lb (64.4 kg)   BMI 25.15 kg/m  General: Well developed white female in no acute distress Head: Normocephalic and atraumatic Eyes:  Sclerae anicteric, conjunctiva pink. Ears: Normal auditory acuity  Lungs: Clear throughout to auscultation; no W/R/R. Heart: Regular rate and rhythm; no M/R/G. Abdomen: Soft, non-distended.  BS present.  Mild epigastric TTP. Musculoskeletal: Symmetrical with no  gross deformities  Skin: No lesions on visible extremities Extremities: No edema  Neurological: Alert oriented x 4, grossly non-focal Psychological:  Alert and cooperative. Normal mood and affect  ASSESSMENT AND PLAN: *GERD with history of cough that has been somewhat recurrent, having intermittent reflux at nighttime.  Noticed this definitely with red wine, etc.  We discussed reflux diet, possibly avoiding red wine.  Make sure she is not eating too late at night.  She recently inclined the head of her bed.  She tells me that she was advised that she could not take PPIs years ago due to her microscopic colitis.  She is on Pepcid  20 mg BID.  We can certainly try  to increase that dose at least for a while to 40 mg twice daily and see if that makes a difference.  Prescription sent to pharmacy.  She will try that for a month or so, but if she has not noticed any other improvement with the higher dose then we will go back to the 20 mg twice daily instead.  Could also switch to another H2 blocker instead if needed or ? Voquezna.  ? Consider repeat EGD as well.  She will message with an update in about a month.   CC:  Piazza, Michael J, MD

## 2023-06-21 IMAGING — DX DG WRIST COMPLETE 3+V*R*
4 series · 4 of 4 positions shown · non-contrast
Comparison: None Available.

CLINICAL DATA: Chronic right wrist pain.

EXAM:
RIGHT WRIST - COMPLETE 3+ VIEW

[wrist ap]
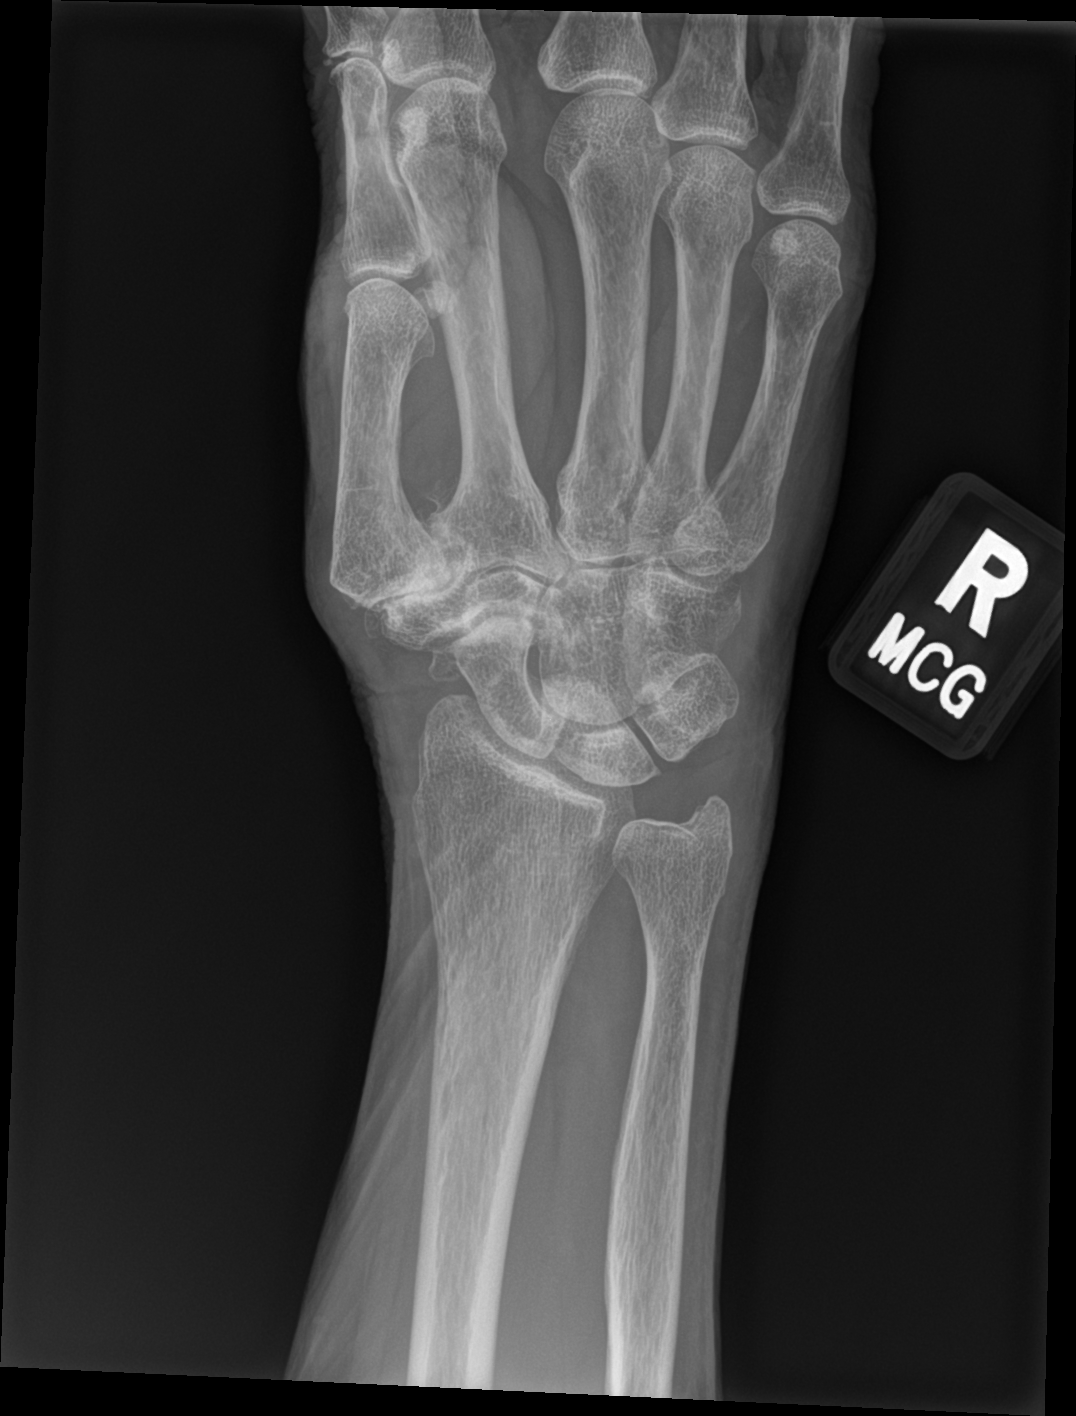

[wrist obl]
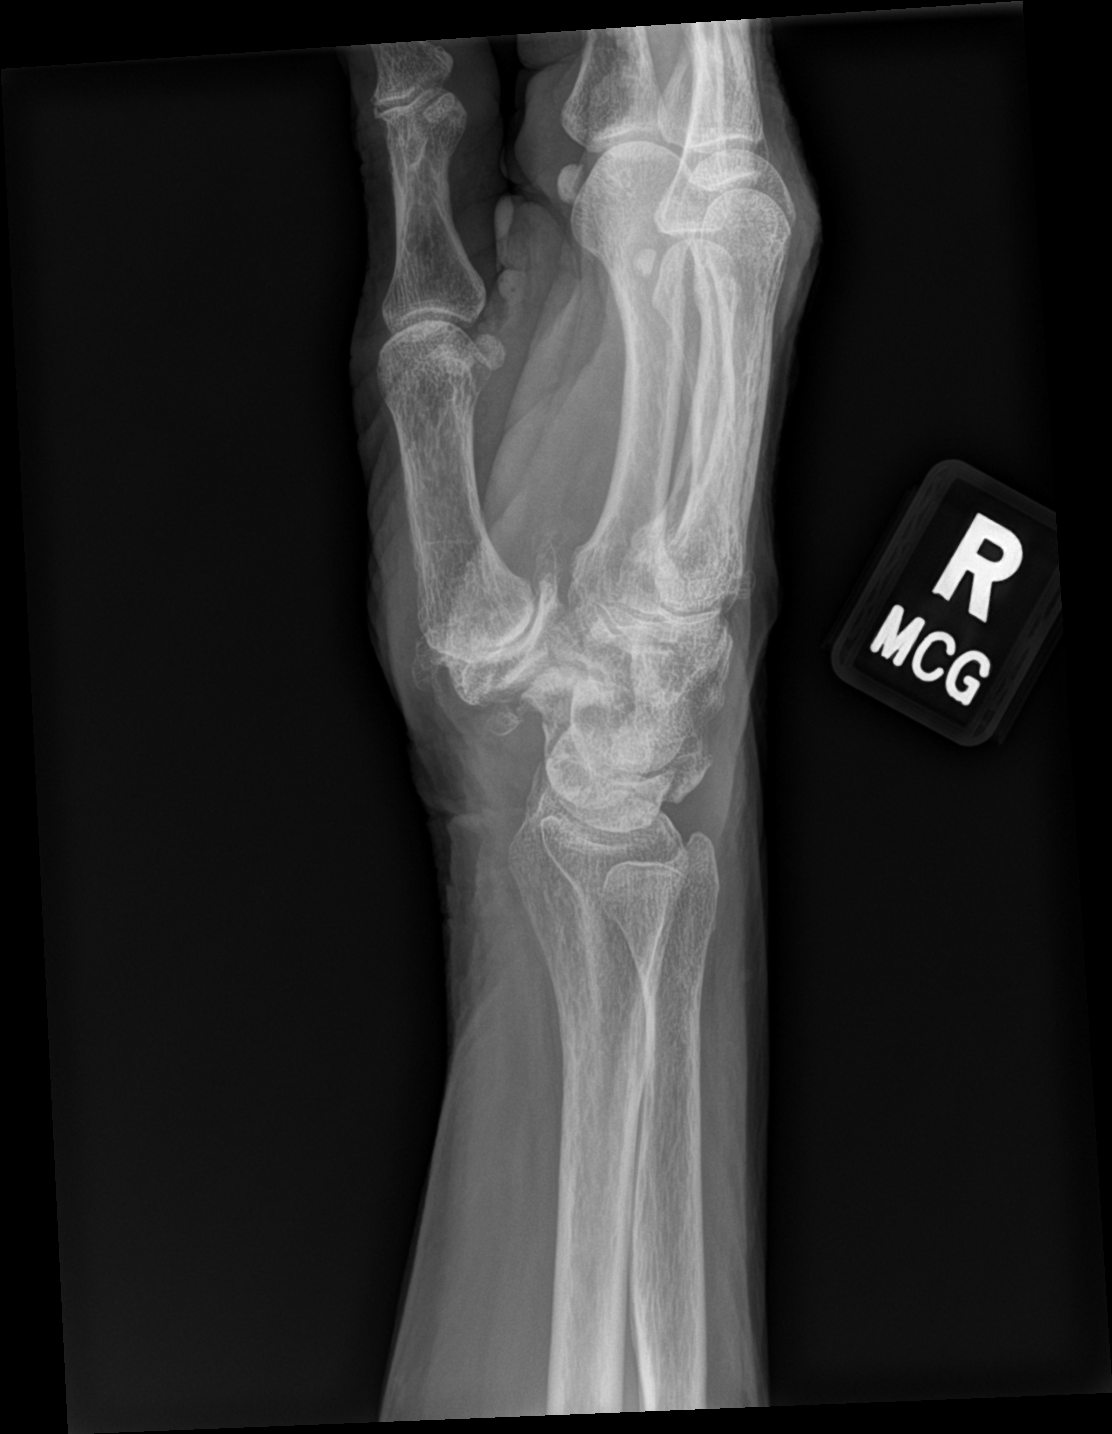

[wrist lat]
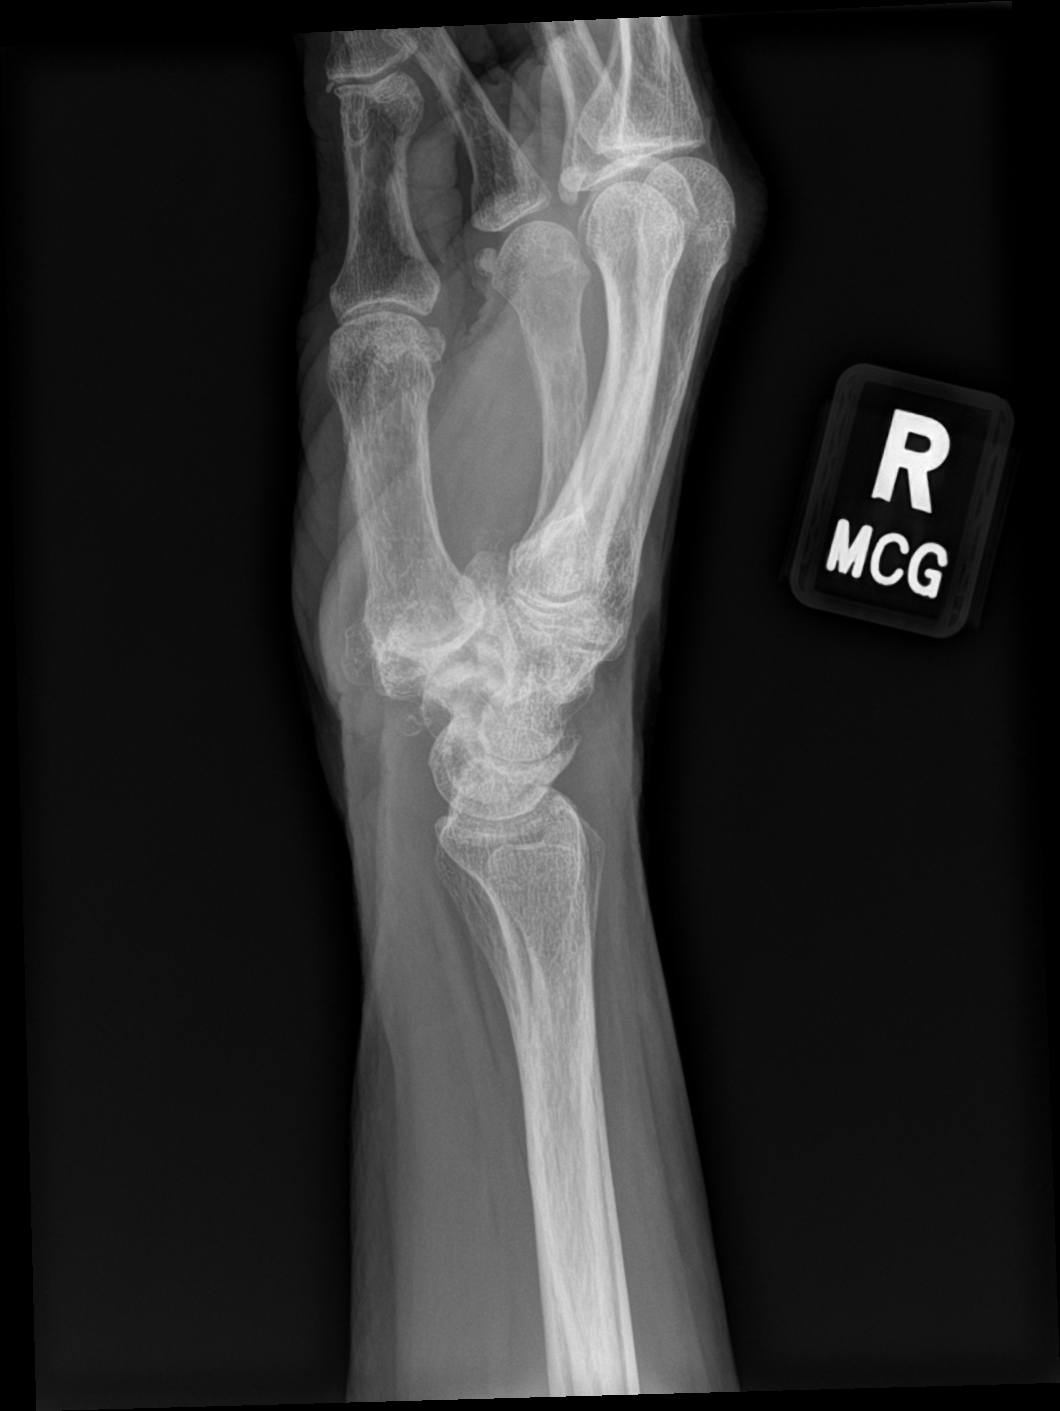

[scaphoid]
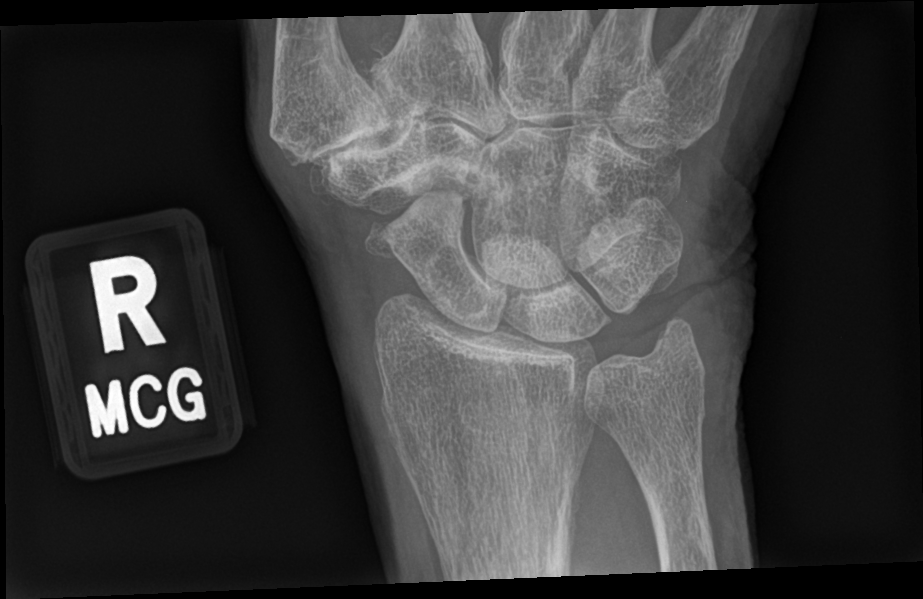

[4 of 4 positions shown; findings below may reference images not displayed]

FINDINGS: There is mild approximate 2 mm ulnar negative variance. Moderate to
severe triscaphe and severe thumb carpometacarpal joint space
narrowing. Thumb carpometacarpal subchondral sclerosis and
high-grade peripheral osteophytosis degenerative changes. There is a
small approximate 4 mm ossicle just lateral to the distal scaphoid
and proximal to the trapezium that is favored to represent a chronic
degenerative ossicle. A recent fracture of a distal lateral scaphoid
osteophyte is felt less likely.

Mild-to-moderate second carpometacarpal joint space narrowing.

No dislocation.
IMPRESSION: Severe thumb carpometacarpal and moderate triscaphe osteoarthritis.

There is a well corticated, likely chronic ossicle just lateral to
the distal scaphoid. This is not favored to represent a fracture.

## 2023-07-27 ENCOUNTER — Telehealth: Payer: Self-pay

## 2023-07-27 NOTE — Telephone Encounter (Signed)
-----   Message from [place] D. Zehr sent at 07/27/2023  4:41 PM EST ----- Please reach out to the patient and see how her cough and reflux symptoms are doing on the higher dose Pepcid therapy.  We had increased it to 40 mg twice daily.  If still having issues please offer her EGD with Dr. Leonides Schanz.

## 2023-07-28 NOTE — Telephone Encounter (Signed)
Spoke with the pt she tells me that she is doing very well and will call if she has any concerns or complaints

## 2023-12-20 ENCOUNTER — Encounter: Payer: Self-pay | Admitting: Family Medicine

## 2023-12-20 ENCOUNTER — Ambulatory Visit (INDEPENDENT_AMBULATORY_CARE_PROVIDER_SITE_OTHER)

## 2023-12-20 ENCOUNTER — Ambulatory Visit: Admitting: Family Medicine

## 2023-12-20 ENCOUNTER — Other Ambulatory Visit: Payer: Self-pay

## 2023-12-20 VITALS — BP 110/80 | HR 64 | Ht 63.0 in | Wt 141.0 lb

## 2023-12-20 DIAGNOSIS — G8929 Other chronic pain: Secondary | ICD-10-CM

## 2023-12-20 DIAGNOSIS — M25561 Pain in right knee: Secondary | ICD-10-CM

## 2023-12-20 DIAGNOSIS — M25551 Pain in right hip: Secondary | ICD-10-CM

## 2023-12-20 NOTE — Patient Instructions (Addendum)
 Thank you for coming in today.   You received an injection today. Seek immediate medical attention if the joint becomes red, extremely painful, or is oozing fluid.   Please get an Xray today before you leave   See you back as needed. Reminder, I will be out of the office August 1st through Mid-September.

## 2023-12-20 NOTE — Progress Notes (Signed)
 I, Molly Cabrera, CMA acting as a scribe for Molly Lloyd, MD.  Molly Cabrera is a 73 y.o. female who presents to [facility] at [facility] today for R knee pain. Pt was previously seen by Dr. Lloyd on 01/18/23 for bilat foot pain.  Today, pt c/o R knee pain x 2-3 months, no known injury. Pt locates pain to lateral aspect. Denies mechanical sx. Sx worse with sitting. Pain radiating into the upper leg and groin at times. Denies LBP. Denies visible swelling. Avoids NSAID's d/t microscopic colitis. Denies night disturbance.   R Knee swelling: denies Mechanical symptoms: denies Aggravates: sitting Treatments tried: Voltaren Gel, Meloxicam, Tylenol, ice.   Pertinent review of systems: No fevers or chills  Relevant historical information: Dupuytren's contracture and psoriasis.   Exam:  BP 110/80   Pulse 64   Ht 5' 3 (1.6 m)   Wt 141 lb (64 kg)   SpO2 94%   BMI 24.98 kg/m  General: Well Developed, well nourished, and in no acute distress.   MSK: Right hip: Normal-appearing Normal motion. Intact strength.  Right knee: Normal-appearing Tender palpation lateral joint line. Normal motion. Intact strength. Positive McMurray's test. Stable ligamentous exam.   Lab and Radiology Results  Procedure: Real-time Ultrasound Guided Injection of right knee joint superior lateral patella space Device: Philips Affiniti 50G/GE Logiq Images permanently stored and available for review in PACS Ultrasound evaluation prior to injection reveals degenerative appearing lateral meniscus. Verbal informed consent obtained.  Discussed risks and benefits of procedure. Warned about infection, bleeding, hyperglycemia damage to structures among others. Patient expresses understanding and agreement Time-out conducted.   Noted no overlying erythema, induration, or other signs of local infection.   Skin prepped in a sterile fashion.   Local anesthesia: Topical Ethyl chloride.   With  sterile technique and under real time ultrasound guidance: 40 mg of Kenalog and 2 mL of Marcaine injected into knee joint. Fluid seen entering the joint capsule.   Completed without difficulty   Pain immediately resolved suggesting accurate placement of the medication.   Advised to call if fevers/chills, erythema, induration, drainage, or persistent bleeding.   Images permanently stored and available for review in the ultrasound unit.  Impression: Technically successful ultrasound guided injection.   X-ray images right hip and right knee obtained today personally and independently interpreted.  Right hip: No significant degenerative changes.  No acute fractures.  Normal-appearing x-ray.  Right knee: Mild DJD.  No acute fractures.  Await formal radiology review    Assessment and Plan: 73 y.o. female with right lateral knee pain due to degeneration of the lateral meniscus.  Plan for steroid injection.  Consider physical therapy or ultimately even MRI.  Right hip pain: Pain is more anterior hip could be impingement.  For now watchful waiting.  Consider trial of targeted injection or ultimately even MRI arthrogram if needed.   Right lateral thigh sensation/paresthesia could be meralgia paresthetica.  Plan for watchful waiting.  Consider gabapentin if needed.  Consider targeted lateral femoral cutaneous nerve injection/hydrodissection in the future.  Recheck back as needed.  PDMP not reviewed this encounter. Orders Placed This Encounter  Procedures   US  LIMITED JOINT SPACE STRUCTURES LOW RIGHT(NO LINKED CHARGES)    Reason for Exam (SYMPTOM  OR DIAGNOSIS REQUIRED):   right knee pain    Preferred imaging location?:   [facility] Knee AP/LAT W/[place] Right    Standing Status:   Future    Number of  Occurrences:   1    Expiration Date:   01/20/2024    Reason for Exam (SYMPTOM  OR DIAGNOSIS REQUIRED):   right knee pain    Preferred imaging location?:   [place]  [employer]   DG HIP UNILAT W OR W/O PELVIS 2-3 VIEWS RIGHT    Standing Status:   Future    Number of Occurrences:   1    Expiration Date:   01/20/2024    Reason for Exam (SYMPTOM  OR DIAGNOSIS REQUIRED):   right hip pain    Preferred imaging location?:   [facility] [place]   No orders of the defined types were placed in this encounter.    Discussed warning signs or symptoms. Please see discharge instructions. Patient expresses understanding.   The above documentation has been reviewed and is accurate and complete Molly Cabrera, M.D.

## 2023-12-21 ENCOUNTER — Ambulatory Visit: Payer: Self-pay | Admitting: Family Medicine

## 2023-12-21 NOTE — Progress Notes (Signed)
 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

## 2023-12-21 NOTE — Progress Notes (Signed)
 Right knee x-ray shows mild arthritis.

## 2024-03-06 ENCOUNTER — Encounter: Payer: Self-pay | Admitting: Family Medicine

## 2024-03-07 ENCOUNTER — Telehealth: Payer: Self-pay | Admitting: Family Medicine

## 2024-03-07 NOTE — Telephone Encounter (Signed)
 Patient called and scheduled an appointment for an injection and would like the same injection she had the last time. Please confirm what injection the patient received.

## 2024-03-24 ENCOUNTER — Other Ambulatory Visit: Payer: Self-pay

## 2024-03-24 ENCOUNTER — Encounter: Payer: Self-pay | Admitting: Family Medicine

## 2024-03-24 ENCOUNTER — Ambulatory Visit: Admitting: Family Medicine

## 2024-03-24 VITALS — BP 142/84 | HR 66 | Ht 63.0 in

## 2024-03-24 DIAGNOSIS — G8929 Other chronic pain: Secondary | ICD-10-CM | POA: Diagnosis not present

## 2024-03-24 DIAGNOSIS — M7711 Lateral epicondylitis, right elbow: Secondary | ICD-10-CM

## 2024-03-24 DIAGNOSIS — M25561 Pain in right knee: Secondary | ICD-10-CM | POA: Diagnosis not present

## 2024-03-24 DIAGNOSIS — M1711 Unilateral primary osteoarthritis, right knee: Secondary | ICD-10-CM | POA: Diagnosis not present

## 2024-03-24 NOTE — Progress Notes (Signed)
   I, Leotis Batter, CMA acting as a scribe for Artist Lloyd, MD.  Glayds Insco is a 73 y.o. female who presents to [facility] at [facility] today for exacerbation of her R knee pain. Pt was previously seen by Dr. Lloyd on 12/20/23 and was given a R knee steroid injection.  Today, pt reports about 2 months of relief with last steroid injection. Sx exacerbation over the past couple of weeks. Denies visible swelling. Denies mechanical sx. Taking Tylenol ES prn.   Also c/o right elbow pain off and on x a couple of months, increasing in frequency more recently. Locates pain to lateral aspect of the elbow. Sx exacerbated by doing things such as opening the door. Denies radiating pain. Denies injury.   Dx imaging: 12/20/23 R knee XR  Pertinent review of systems: No fevers or chills  Relevant historical information: Lateral epicondylitis history.   Exam:  BP (!) 142/84   Pulse 66   Ht 5' 3 (1.6 m)   SpO2 94%   BMI 24.98 kg/m  General: Well Developed, well nourished, and in no acute distress.   MSK: Right knee mild effusion normal.  Otherwise normal motion.  Stable ligamentous exam.  Intact strength.  Right elbow normal-appearing Range of motion lacks full extension and supination otherwise intact. Strength is intact. Tender palpation at lateral epicondyle.  Pain is present with resisted wrist extension.    Lab and Radiology Results  Procedure: Real-time Ultrasound Guided Injection of right knee joint superior lateral patella space Device: Philips Affiniti 50G/GE Logiq Images permanently stored and available for review in PACS Verbal informed consent obtained.  Discussed risks and benefits of procedure. Warned about infection, bleeding, hyperglycemia damage to structures among others. Patient expresses understanding and agreement Time-out conducted.   Noted no overlying erythema, induration, or other signs of local infection.   Skin prepped in a sterile fashion.    Local anesthesia: Topical Ethyl chloride.   With sterile technique and under real time ultrasound guidance: 40 mg of Kenalog and 2 mL of Marcaine injected into knee joint. Fluid seen entering the joint capsule.   Completed without difficulty   Pain immediately resolved suggesting accurate placement of the medication.   Advised to call if fevers/chills, erythema, induration, drainage, or persistent bleeding.   Images permanently stored and available for review in the ultrasound unit.  Impression: Technically successful ultrasound guided injection.       Assessment and Plan: 73 y.o. 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  If today's injection does not last longer than 3 months we can work on authorization for Zilretta  or gel injections.  Additionally if not better we could consider MRI to further characterize source of pain.  Right lateral elbow pain.  This is lateral epicondylitis and has a acute recurrence of a chronic problem.  Reviewed home exercise program Voltaren gel and elbow bracing.  PDMP not reviewed this encounter. Orders Placed This Encounter  Procedures   US  LIMITED JOINT SPACE STRUCTURES LOW RIGHT(NO LINKED CHARGES)    Reason for Exam (SYMPTOM  OR DIAGNOSIS REQUIRED):   right knee pain    Preferred imaging location?:   [facility]   No orders of the defined types were placed in this encounter.    Discussed warning signs or symptoms. Please see discharge instructions. Patient expresses understanding.   The above documentation has been reviewed and is accurate and complete Artist Lloyd, M.D.

## 2024-03-24 NOTE — Patient Instructions (Addendum)
 Thank you for coming in today.   Please work on the home exercises the athletic trainer went over with you:  View at [email or web address] code [id number]  You received an injection today. Seek immediate medical attention if the joint becomes red, extremely painful, or is oozing fluid.   Please use Voltaren gel (Generic Diclofenac Gel) up to 4x daily for pain as needed.  This is available over-the-counter as both the name brand Voltaren gel and the generic diclofenac gel.   Let us  know if this shot does last longer than 3 months, and we will check benefits for Zilretta  injection.   See you back as needed.

## 2024-04-05 ENCOUNTER — Other Ambulatory Visit: Payer: Self-pay | Admitting: Gastroenterology

## 2024-06-13 ENCOUNTER — Encounter: Payer: Self-pay | Admitting: Family Medicine

## 2024-06-21 ENCOUNTER — Ambulatory Visit: Admitting: Family Medicine

## 2024-06-21 ENCOUNTER — Other Ambulatory Visit: Payer: Self-pay

## 2024-06-21 VITALS — BP 144/86 | HR 73 | Ht 63.0 in | Wt 146.0 lb

## 2024-06-21 DIAGNOSIS — M1711 Unilateral primary osteoarthritis, right knee: Secondary | ICD-10-CM | POA: Diagnosis not present

## 2024-06-21 DIAGNOSIS — G8929 Other chronic pain: Secondary | ICD-10-CM | POA: Diagnosis not present

## 2024-06-21 DIAGNOSIS — M25561 Pain in right knee: Secondary | ICD-10-CM | POA: Diagnosis not present

## 2024-06-21 NOTE — Progress Notes (Signed)
"       ° °  LILLETTE Ileana Collet, PhD, LAT, ATC acting as a scribe for Artist Lloyd, MD.  Molly Cabrera is a 74 y.o. female who presents to [facility] at [facility] today for exacerbation of her R knee pain. Pt was previously seen by Dr. Lloyd on 03/24/24 and was given a repeat R knee steroid injection.   Today, pt reports prior CSI lasted about 2.5 months. Pain seems to be worse w/ rest; sitting or laying down. Pain was severe yesterday and she had to borrow one of her husband's norco.     Dx imaging: 12/20/23 R knee XR   Pertinent review of systems: No fevers or chills  Relevant historical information: Psoriasis previously treated with methotrexate.  Patient has discontinued methotrexate and is interested in other medicines.  She is switching to a different dermatologist.  History of left total knee replacement.  Exam:  BP (!) 144/86   Pulse 73   Ht 5' 3 (1.6 m)   Wt 146 lb (66.2 kg)   SpO2 96%   BMI 25.86 kg/m  General: Well Developed, well nourished, and in no acute distress.   MSK: Right knee moderate effusion normal motion with crepitation.    Lab and Radiology Results  Procedure: Real-time Ultrasound Guided Injection of right knee joint superior lateral patella space Device: Philips Affiniti 50G/GE Logiq Images permanently stored and available for review in PACS Verbal informed consent obtained.  Discussed risks and benefits of procedure. Warned about infection, bleeding, hyperglycemia damage to structures among others. Patient expresses understanding and agreement Time-out conducted.   Noted no overlying erythema, induration, or other signs of local infection.   Skin prepped in a sterile fashion.   Local anesthesia: Topical Ethyl chloride.   With sterile technique and under real time ultrasound guidance: 40 mg of Kenalog and 2 mL of Marcaine injected into knee joint. Fluid seen entering the joint capsule.   Completed without difficulty   Pain immediately  resolved suggesting accurate placement of the medication.   Advised to call if fevers/chills, erythema, induration, drainage, or persistent bleeding.   Images permanently stored and available for review in the ultrasound unit.  Impression: Technically successful ultrasound guided injection.        Assessment and Plan: 74 y.o. female with right knee pain due to DJD plan for steroid injection today.  Her injections are not lasting 3 months.  When the pain returns she can contact the office and we can get authorization for Zilretta .   PDMP not reviewed this encounter. Orders Placed This Encounter  Procedures   US  LIMITED JOINT SPACE STRUCTURES LOW RIGHT(NO LINKED CHARGES)    Reason for Exam (SYMPTOM  OR DIAGNOSIS REQUIRED):   right knee pain    Preferred imaging location?:   [facility]   No orders of the defined types were placed in this encounter.    Discussed warning signs or symptoms. Please see discharge instructions. Patient expresses understanding.   The above documentation has been reviewed and is accurate and complete Artist Lloyd, M.D.   "

## 2024-06-21 NOTE — Patient Instructions (Addendum)
 Thank you for coming in today.   You received an injection today. Seek immediate medical attention if the joint becomes red, extremely painful, or is oozing fluid.   Let me know when your knee starts hurting again, and we will work to authorize Zilretta
